# Patient Record
Sex: Male | Born: 1970 | Race: White | Hispanic: No | Marital: Single | Smoking: Never smoker
Health system: Southern US, Community
[De-identification: ages and names within clinical notes are randomized; demographics above are authoritative.]

## PROBLEM LIST (undated history)

## (undated) HISTORY — PX: APPENDECTOMY: SHX54

---

## 2005-06-12 ENCOUNTER — Other Ambulatory Visit: Payer: Self-pay

## 2005-06-12 ENCOUNTER — Emergency Department: Payer: Self-pay | Admitting: Emergency Medicine

## 2005-06-15 ENCOUNTER — Ambulatory Visit: Payer: Self-pay | Admitting: Emergency Medicine

## 2015-12-04 ENCOUNTER — Ambulatory Visit
Admission: EM | Admit: 2015-12-04 | Discharge: 2015-12-04 | Disposition: A | Discharge status: Home / Self Care | Payer: BLUE CROSS/BLUE SHIELD | Attending: Family Medicine | Admitting: Family Medicine

## 2015-12-04 ENCOUNTER — Ambulatory Visit (INDEPENDENT_AMBULATORY_CARE_PROVIDER_SITE_OTHER): Payer: BLUE CROSS/BLUE SHIELD

## 2015-12-04 ENCOUNTER — Encounter: Payer: Self-pay | Admitting: *Deleted

## 2015-12-04 DIAGNOSIS — S61212A Laceration without foreign body of right middle finger without damage to nail, initial encounter: Secondary | ICD-10-CM | POA: Diagnosis not present

## 2015-12-04 DIAGNOSIS — S61215A Laceration without foreign body of left ring finger without damage to nail, initial encounter: Secondary | ICD-10-CM

## 2015-12-04 MED ORDER — AMOXICILLIN-POT CLAVULANATE 875-125 MG PO TABS: 1.0000 | ORAL_TABLET | Freq: Two times a day (BID) | ORAL | 0 refills | Status: AC

## 2015-12-04 NOTE — ED Provider Notes (Signed)
[facility]    CSN: [id number] Arrival date & time: 12/04/15  1146     History   Chief Complaint Chief Complaint  Patient presents with  . Finger Injury    HPI Trevor Malone is a 45 y.o. male.   45 yo male with a c/o laceration to  His right middle and ring finger  today while using a strap wrench. States it snapped back and hit him on his fingers causing lacerations to his fingers as above. States he can move his fingers, grasp. States his last tetanus vaccine was 1 and half years ago.         The history is provided by the patient.    History reviewed. No pertinent past medical history.  There are no active problems to display for this patient.   Past Surgical History:  Procedure Laterality Date  . APPENDECTOMY         Home Medications    Prior to Admission medications   Medication Sig Start Date End Date Taking? Authorizing Provider  amoxicillin-clavulanate (AUGMENTIN) 875-125 MG tablet Take 1 tablet by mouth 2 (two) times daily. 12/04/15   Payton Mccallum, MD    Family History History reviewed. No pertinent family history.  Social History Social History  Substance Use Topics  . Smoking status: Never Smoker  . Smokeless tobacco: Current User  . Alcohol use No     Allergies   Review of patient's allergies indicates no known allergies.   Review of Systems Review of Systems   Physical Exam Triage Vital Signs ED Triage Vitals  Enc Vitals Group     BP 12/04/15 1213 (!) 115/92     Pulse Rate 12/04/15 1213 89     Resp 12/04/15 1213 18     Temp 12/04/15 1213 98.4 F (36.9 C)     Temp Source 12/04/15 1213 Oral     SpO2 12/04/15 1213 100 %     Weight 12/04/15 1218 190 lb (86.2 kg)     Height 12/04/15 1218 5\' 11"  (1.803 m)     Head Circumference --      Peak Flow --      Pain Score 12/04/15 1222 0     Pain Loc --      Pain Edu? --      Excl. in GC? --    No data found.   Updated Vital Signs BP (!) 115/92 (BP Location:  Left Arm)   Pulse 89   Temp 98.4 F (36.9 C) (Oral)   Resp 18   Ht 5\' 11"  (1.803 m)   Wt 190 lb (86.2 kg)   SpO2 100%   BMI 26.50 kg/m   Visual Acuity Right Eye Distance:   Left Eye Distance:   Bilateral Distance:    Right Eye Near:   Left Eye Near:    Bilateral Near:     Physical Exam  Constitutional: He appears well-developed and well-nourished. No distress.  Musculoskeletal:       Right hand: He exhibits laceration. He exhibits normal range of motion, no bony tenderness, normal two-point discrimination, normal capillary refill, no deformity and no swelling. Normal sensation noted. Normal strength noted.       Hands:  (2x) 2 cm lacerations (one on palmar aspect of middle finger and one on palmar aspect of ring finger); sensation normal; strength tested and normal (no evidence of tendon involvement)  Skin: He is not diaphoretic.  Nursing note and vitals reviewed.    UC  Treatments / Results  Labs (all labs ordered are listed, but only abnormal results are displayed) Labs Reviewed - No data to display  EKG  EKG Interpretation None       Radiology Dg Hand Complete Right  Result Date: 12/04/2015 CLINICAL DATA:  Injury. EXAM: RIGHT HAND - COMPLETE 3+ VIEW COMPARISON:  No prior . FINDINGS: No evidence of fracture or dislocation. Tiny metallic density noted in the soft tissues of the lateral palmar aspect of the right hand. IMPRESSION: No acute or focal bony abnormality. Tiny metallic foreign body noted in the soft tissues the lateral palmar aspect of the right hand. Electronically Signed   By: Maisie Fushomas  Register   On: 12/04/2015 12:44    Procedures .Marland Kitchen.Laceration Repair Date/Time: 12/04/2015 2:12 PM Performed by: Payton MccallumONTY, Satori Krabill Authorized by: Payton MccallumONTY, Aviyanna Colbaugh   Consent:    Consent obtained:  Verbal   Consent given by:  Patient   Risks discussed:  Infection, pain, retained foreign body, need for additional repair, poor cosmetic result, poor wound healing, nerve damage,  tendon damage and vascular damage   Alternatives discussed:  No treatment Anesthesia (see MAR for exact dosages):    Anesthesia method:  Local infiltration   Local anesthetic:  Lidocaine 1% w/o epi Laceration details:    Location:  Finger   Finger location:  R long finger (right long finger and right ring finger)   Length (cm):  2 Repair type:    Repair type:  Simple Pre-procedure details:    Preparation:  Patient was prepped and draped in usual sterile fashion Exploration:    Hemostasis achieved with:  Direct pressure   Wound exploration: wound explored through full range of motion and entire depth of wound probed and visualized     Wound extent: no fascia violation noted, no foreign bodies/material noted, no muscle damage noted, no nerve damage noted, no tendon damage noted, no underlying fracture noted and no vascular damage noted     Contaminated: yes   Treatment:    Area cleansed with:  Betadine   Amount of cleaning:  Extensive   Irrigation solution:  Sterile saline   Irrigation method:  Pressure wash   Visualized foreign bodies/material removed: yes   Skin repair:    Repair method:  Sutures   Suture size:  5-0   Suture material:  Nylon   Suture technique:  Simple interrupted   Number of sutures:  5 (5 in each finger (total 10)) Approximation:    Approximation:  Close   Vermilion border: well-aligned   Post-procedure details:    Dressing:  Antibiotic ointment and tube gauze   Patient tolerance of procedure:  Tolerated well, no immediate complications    (including critical care time)  Medications Ordered in UC Medications - No data to display   Initial Impression / Assessment and Plan / UC Course  I have reviewed the triage vital signs and the nursing notes.  Pertinent labs & imaging results that were available during my care of the patient were reviewed by me and considered in my medical decision making (see chart for details).  Clinical Course      Final  Clinical Impressions(s) / UC Diagnoses   Final diagnoses:  Laceration of right middle finger without foreign body without damage to nail, initial encounter  Laceration of left ring finger without foreign body without damage to nail, initial encounter    New Prescriptions Discharge Medication List as of 12/04/2015  1:56 PM    START taking these medications  Details  amoxicillin-clavulanate (AUGMENTIN) 875-125 MG tablet Take 1 tablet by mouth 2 (two) times daily., Starting Wed 12/04/2015, Normal       1. diagnosis reviewed with patient 2. rx as per orders above; reviewed possible side effects, interactions, risks and benefits (empiric treatment due to dirty tools)  3. Wound care instructions explained and given 4. Follow-up prn if symptoms worsen or don't improve   Payton Mccallum, MD 12/04/15 1424

## 2015-12-04 NOTE — ED Triage Notes (Signed)
Patient lacerated his middle and ring finger on his right hand today while using a strap wrench.

## 2015-12-07 ENCOUNTER — Telehealth: Payer: Self-pay | Admitting: *Deleted

## 2018-06-04 IMAGING — CR DG HAND COMPLETE 3+V*R*
3 series · 3 of 3 positions shown · non-contrast
Comparison: No prior .

CLINICAL DATA: Injury.

EXAM:
RIGHT HAND - COMPLETE 3+ VIEW

[hand ap]
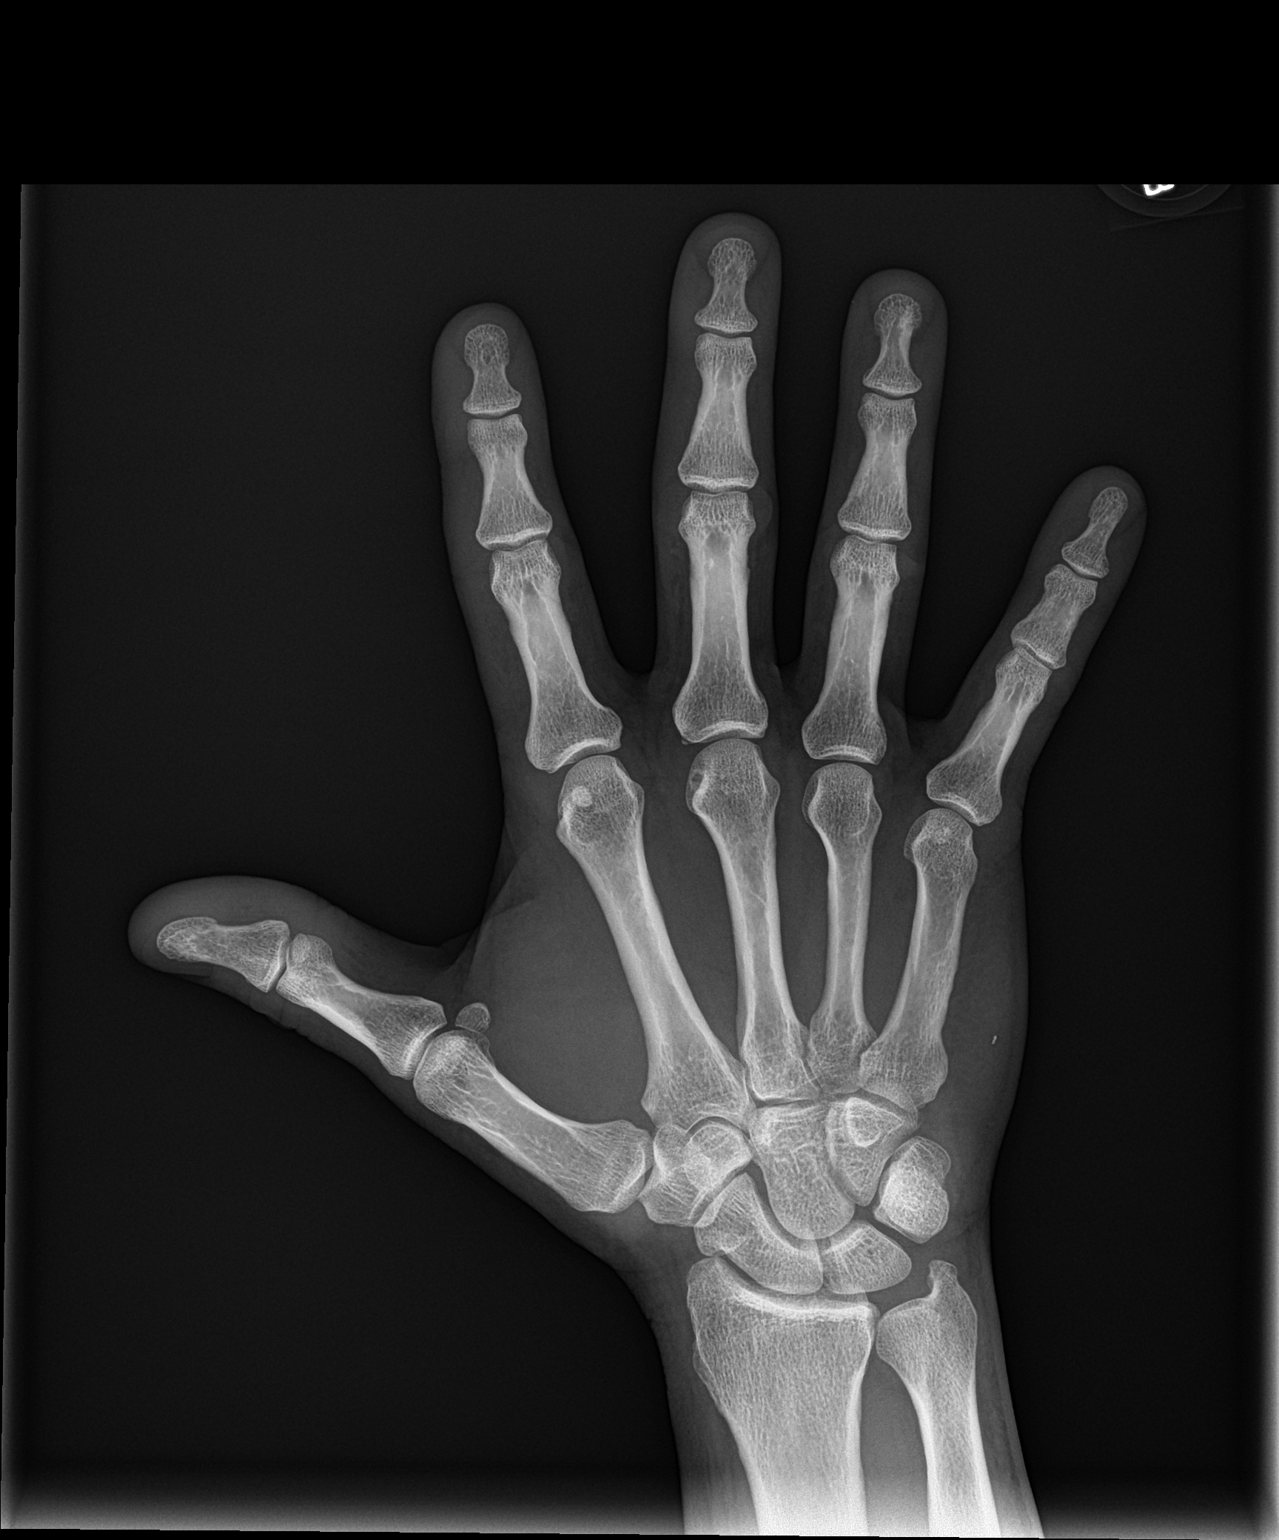

[hand obl]
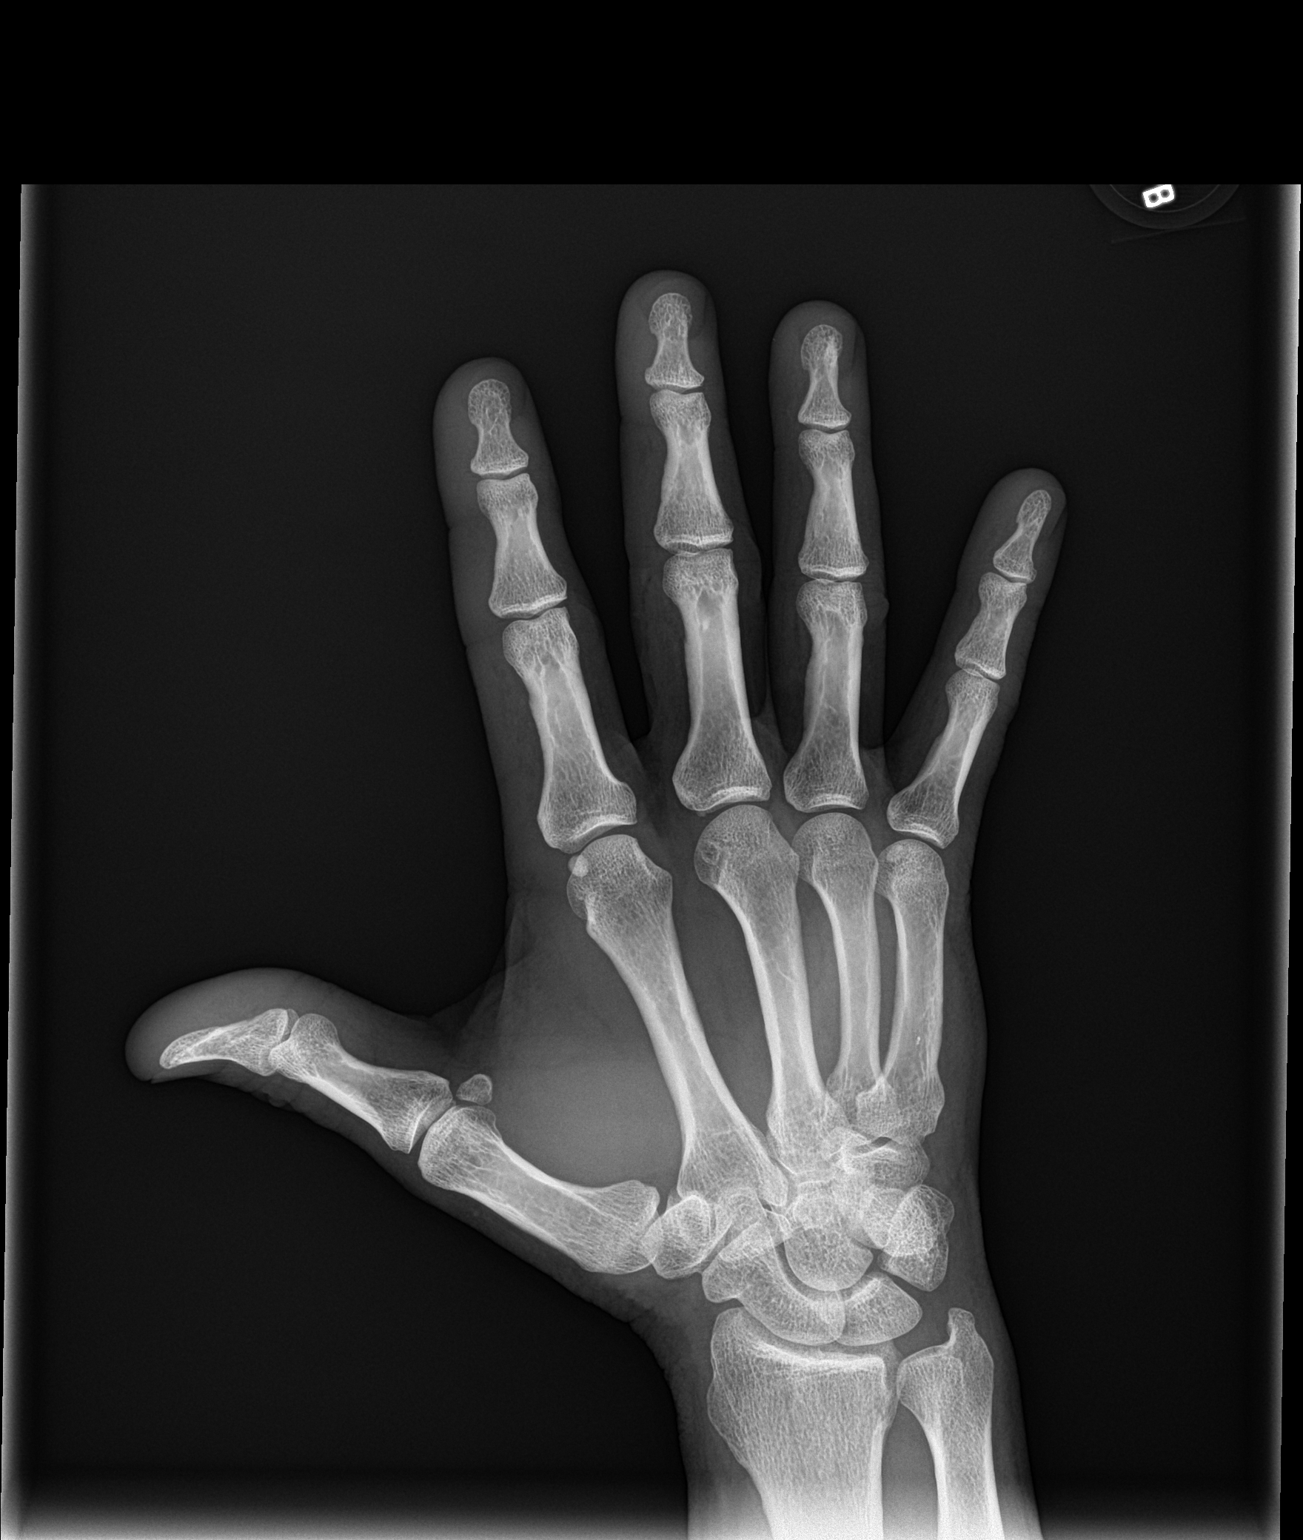

[hand lat]
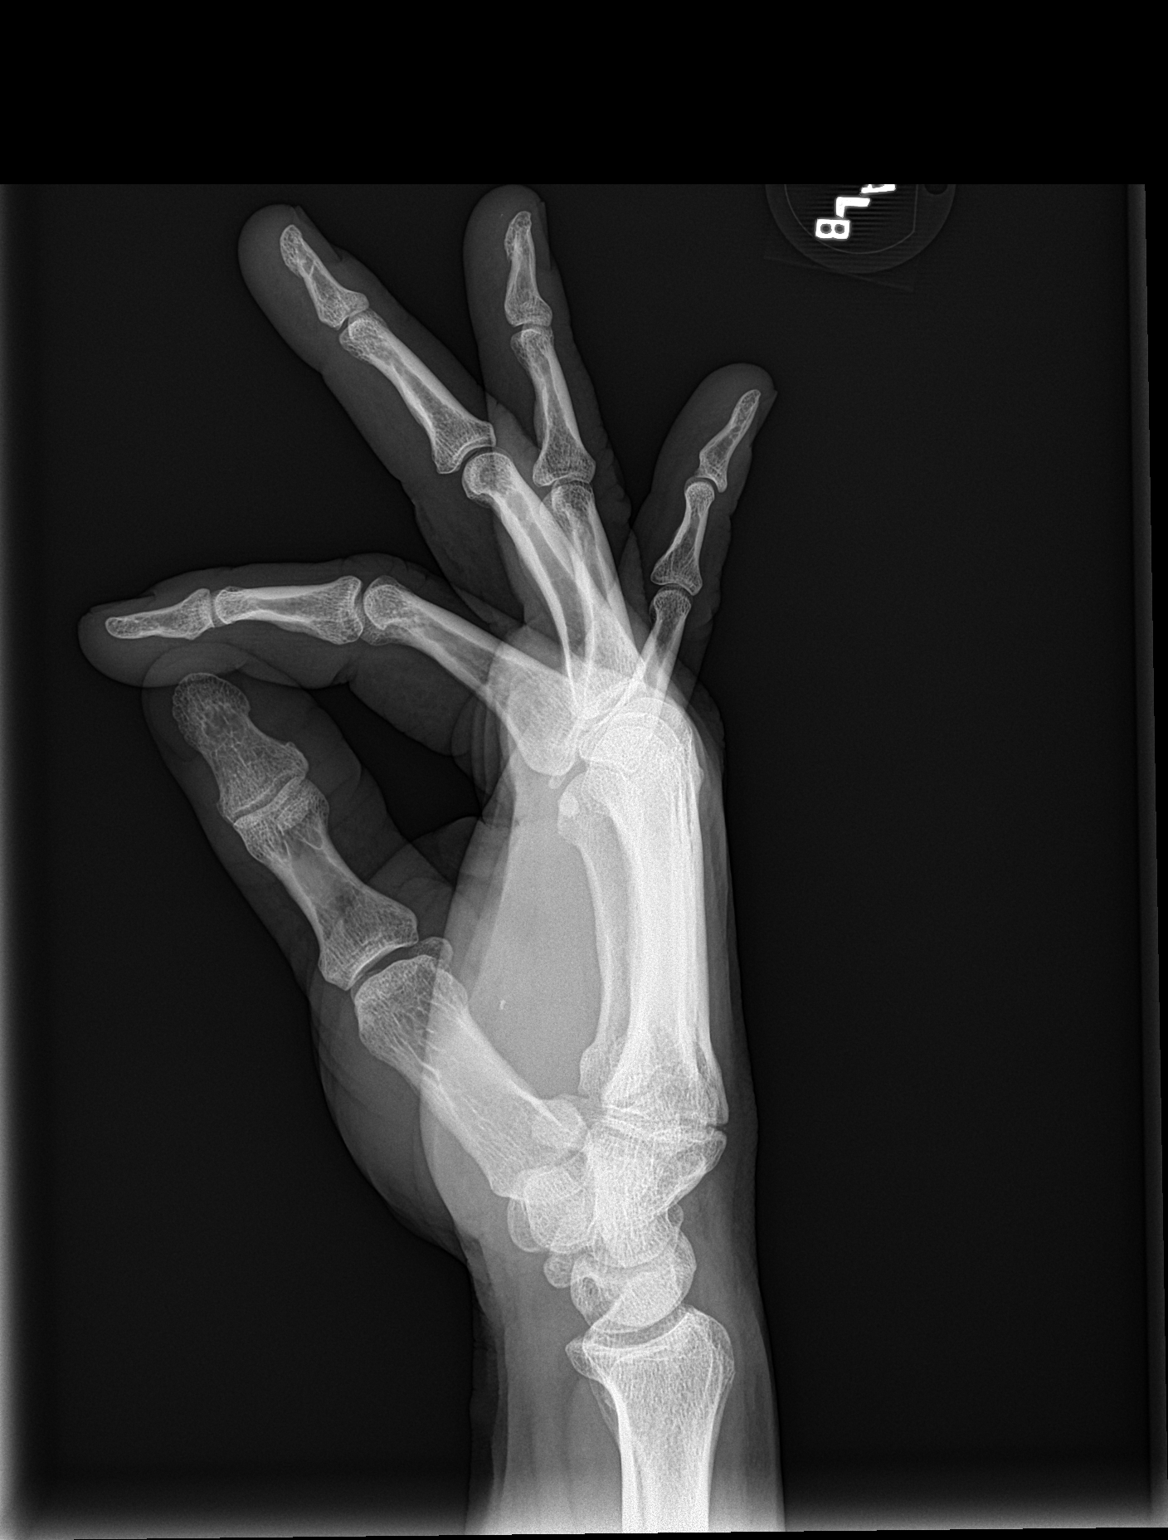

[3 of 3 positions shown; findings below may reference images not displayed]

FINDINGS: No evidence of fracture or dislocation. Tiny metallic density noted
in the soft tissues of the lateral palmar aspect of the right hand.
IMPRESSION: No acute or focal bony abnormality. Tiny metallic foreign body noted
in the soft tissues the lateral palmar aspect of the right hand.
